# Patient Record
Sex: Female | Born: 1995 | Race: Black or African American | Hispanic: No | Marital: Single | Smoking: Current every day smoker
Health system: Southern US, Community
[De-identification: ages and names within clinical notes are randomized; demographics above are authoritative.]

## PROBLEM LIST (undated history)

## (undated) DIAGNOSIS — K5900 Constipation, unspecified: Secondary | ICD-10-CM

## (undated) DIAGNOSIS — A599 Trichomoniasis, unspecified: Secondary | ICD-10-CM

## (undated) DIAGNOSIS — B019 Varicella without complication: Secondary | ICD-10-CM

## (undated) HISTORY — PX: HERNIA REPAIR: SHX51

---
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
## 2017-04-07 ENCOUNTER — Other Ambulatory Visit (HOSPITAL_COMMUNITY)
Admission: RE | Admit: 2017-04-07 | Discharge: 2017-04-07 | Disposition: A | Discharge status: Home / Self Care | Payer: Medicaid Other | Source: Ambulatory Visit | Attending: Family Medicine | Admitting: Family Medicine

## 2017-04-07 ENCOUNTER — Ambulatory Visit (INDEPENDENT_AMBULATORY_CARE_PROVIDER_SITE_OTHER): Payer: Medicaid Other | Admitting: Family Medicine

## 2017-04-07 ENCOUNTER — Encounter: Payer: Self-pay | Admitting: Family Medicine

## 2017-04-07 VITALS — BP 127/80 | HR 64 | Ht 63.5 in | Wt 130.0 lb

## 2017-04-07 DIAGNOSIS — Z113 Encounter for screening for infections with a predominantly sexual mode of transmission: Secondary | ICD-10-CM | POA: Insufficient documentation

## 2017-04-07 DIAGNOSIS — Z01419 Encounter for gynecological examination (general) (routine) without abnormal findings: Secondary | ICD-10-CM | POA: Diagnosis not present

## 2017-04-07 DIAGNOSIS — N839 Noninflammatory disorder of ovary, fallopian tube and broad ligament, unspecified: Secondary | ICD-10-CM

## 2017-04-07 DIAGNOSIS — Z0000 Encounter for general adult medical examination without abnormal findings: Secondary | ICD-10-CM

## 2017-04-07 NOTE — Progress Notes (Signed)
GYNECOLOGY ANNUAL PREVENTATIVE CARE ENCOUNTER NOTE  Subjective:   Diana Ramirez is a 21 y.o. G0P0 female here for a routine annual gynecologic exam.  Current complaints: none.   Denies abnormal vaginal bleeding, discharge, pelvic pain, or other gynecologic concerns.   Has been trying to get pregnant over the past 2 years, but has not been able to. Has irregular menses.   Gynecologic History Patient's last menstrual period was 04/02/2017 (exact date). Patient is sexually active  Contraception: none Last Pap: n/a.  Last mammogram: n/a.   Obstetric History OB History  Gravida Para Term Preterm AB Living  0            SAB TAB Ectopic Multiple Live Births                   Past Medical History:  Diagnosis Date  . Constipation   . Trichomonas   . Varicella     Past Surgical History:  Procedure Laterality Date  . HERNIA REPAIR      Current Outpatient Prescriptions on File Prior to Visit  Medication Sig Dispense Refill  . cetirizine (ZYRTEC) 10 MG tablet Take 10 mg by mouth at bedtime as needed for allergies.    . Pseudoephedrine-APAP-DM (DAYQUIL PO) Take 30 mLs by mouth daily as needed (For cold symptoms.).     No current facility-administered medications on file prior to visit.     No Known Allergies  Social History   Social History  . Marital status: Single    Spouse name: N/A  . Number of children: N/A  . Years of education: N/A   Occupational History  . Not on file.   Social History Main Topics  . Smoking status: Current Every Day Smoker    Packs/day: 0.25    Years: 4.00    Types: Cigarettes  . Smokeless tobacco: Never Used  . Alcohol use Yes     Comment: occas.  . Drug use: Yes    Types: Marijuana     Comment: occas.  . Sexual activity: Yes    Birth control/ protection: Condom   Other Topics Concern  . Not on file   Social History Narrative  . No narrative on file    Family History  Problem Relation Age of Onset  . Kidney disease  Mother   . Hypertension Mother   . HIV Mother   . Diabetes Maternal Grandmother   . Hypertension Maternal Grandmother     The following portions of the patient's history were reviewed and updated as appropriate: allergies, current medications, past family history, past medical history, past social history, past surgical history and problem list.  Review of Systems Pertinent items are noted in HPI.   Objective:  BP 127/80 (BP Location: Left Arm)   Pulse 64   Ht 5' 3.5" (1.613 m)   Wt 130 lb (59 kg)   LMP 04/02/2017 (Exact Date)   BMI 22.67 kg/m  CONSTITUTIONAL: Well-developed, well-nourished female in no acute distress.  HENT:  Normocephalic, atraumatic, External right and left ear normal. Oropharynx is clear and moist EYES: Conjunctivae and EOM are normal. Pupils are equal, round, and reactive to light. No scleral icterus.  NECK: Normal range of motion, supple, no masses.  Normal thyroid.   CARDIOVASCULAR: Normal heart rate noted, regular rhythm RESPIRATORY: Clear to auscultation bilaterally. Effort and breath sounds normal, no problems with respiration noted. BREASTS: Symmetric in size. No masses, skin changes, nipple drainage, or lymphadenopathy. ABDOMEN: Soft, normal bowel  sounds, no distention noted.  No tenderness, rebound or guarding.  PELVIC: Normal appearing external genitalia; normal appearing vaginal mucosa and cervix.  No abnormal discharge noted.  Pap smear obtained.  Normal uterine size, no other palpable masses, no uterine or adnexal tenderness. MUSCULOSKELETAL: Normal range of motion. No tenderness.  No cyanosis, clubbing, or edema.  2+ distal pulses. SKIN: Skin is warm and dry. No rash noted. Not diaphoretic. No erythema. No pallor. NEUROLOGIC: Alert and oriented to person, place, and time. Normal reflexes, muscle tone coordination. No cranial nerve deficit noted. PSYCHIATRIC: Normal mood and affect. Normal behavior. Normal judgment and thought content.  Assessment:   Annual gynecologic examination with pap smear   Plan:  1. Annual exam Discussed diet and exercise.  2. STD screening STD testing discussed. Patient requested testing  3. Problems with ovulation Will get Day 21 progesterone level.  If normal - would recommend OTC ovulation kit.  Routine preventative health maintenance measures emphasized. Please refer to After Visit Summary for other counseling recommendations.    Loma Boston, [place] for [employer]

## 2017-04-08 LAB — HEPATITIS C ANTIBODY

## 2017-04-08 LAB — HEPATITIS B SURFACE ANTIGEN: HEP B S AG: NEGATIVE

## 2017-04-08 LAB — RPR: RPR Ser Ql: NONREACTIVE

## 2017-04-08 LAB — HIV ANTIBODY (ROUTINE TESTING W REFLEX): HIV SCREEN 4TH GENERATION: NONREACTIVE

## 2017-04-11 LAB — GC/CHLAMYDIA PROBE AMP (~~LOC~~) NOT AT ARMC
Chlamydia: NEGATIVE
NEISSERIA GONORRHEA: NEGATIVE

## 2017-04-25 ENCOUNTER — Other Ambulatory Visit: Payer: Medicaid Other

## 2017-04-26 ENCOUNTER — Other Ambulatory Visit: Payer: Medicaid Other

## 2017-04-26 DIAGNOSIS — N839 Noninflammatory disorder of ovary, fallopian tube and broad ligament, unspecified: Secondary | ICD-10-CM

## 2017-04-26 NOTE — Progress Notes (Signed)
Patient present for day 21 progestrone level per Dr. Adrian Blackwater order at last visit. Armandina Stammer RNBSN

## 2017-04-27 LAB — PROGESTERONE: Progesterone: 10.1 ng/mL

## 2017-05-12 ENCOUNTER — Telehealth: Payer: Self-pay

## 2017-05-12 NOTE — Telephone Encounter (Signed)
Attempted to reach patient. Patient voicemailbox not set up yet. Armandina Stammer RNBSN

## 2017-05-12 NOTE — Telephone Encounter (Signed)
-----  Message from Jacob J Stinson, DO sent at 05/10/2017 11:50 AM EDT ----- Progesterone level ok. No problems with ovulation. She may want to get an OTC ovulation kit that may help her time her ovulation if she is looking at getting pregnant still. 

## 2019-11-28 ENCOUNTER — Ambulatory Visit
Admission: EM | Admit: 2019-11-28 | Discharge: 2019-11-28 | Disposition: A | Discharge status: Home / Self Care | Payer: Medicaid Other | Attending: Family Medicine | Admitting: Family Medicine

## 2019-11-28 ENCOUNTER — Other Ambulatory Visit: Payer: Self-pay

## 2019-11-28 DIAGNOSIS — R6883 Chills (without fever): Secondary | ICD-10-CM

## 2019-11-28 DIAGNOSIS — R05 Cough: Secondary | ICD-10-CM

## 2019-11-28 DIAGNOSIS — J3489 Other specified disorders of nose and nasal sinuses: Secondary | ICD-10-CM

## 2019-11-28 DIAGNOSIS — R112 Nausea with vomiting, unspecified: Secondary | ICD-10-CM

## 2019-11-28 DIAGNOSIS — R059 Cough, unspecified: Secondary | ICD-10-CM

## 2019-11-28 DIAGNOSIS — R0981 Nasal congestion: Secondary | ICD-10-CM

## 2019-11-28 DIAGNOSIS — R52 Pain, unspecified: Secondary | ICD-10-CM

## 2019-11-28 DIAGNOSIS — M79602 Pain in left arm: Secondary | ICD-10-CM

## 2019-11-28 MED ORDER — NAPROXEN 500 MG PO TABS
500.0000 mg | ORAL_TABLET | Freq: Two times a day (BID) | ORAL | 0 refills | Status: DC
Start: 2019-11-28 — End: 2022-03-25

## 2019-11-28 MED ORDER — BENZONATATE 100 MG PO CAPS
100.0000 mg | ORAL_CAPSULE | Freq: Three times a day (TID) | ORAL | 0 refills | Status: DC
Start: 2019-11-28 — End: 2022-03-25

## 2019-11-28 MED ORDER — CYCLOBENZAPRINE HCL 10 MG PO TABS
10.0000 mg | ORAL_TABLET | Freq: Two times a day (BID) | ORAL | 0 refills | Status: DC | PRN
Start: 2019-11-28 — End: 2022-03-25

## 2019-11-28 MED ORDER — CETIRIZINE HCL 10 MG PO TABS: 10.0000 mg | ORAL_TABLET | Freq: Every day | ORAL | 0 refills | Status: DC
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
## 2019-11-28 NOTE — Discharge Instructions (Addendum)
Your COVID test is pending.  You should self quarantine until the test result is back.    Take Tylenol as needed for fever or discomfort.  Rest and keep yourself hydrated.    Go to the emergency department if you develop shortness of breath, severe diarrhea, high fever not relieved by Tylenol or ibuprofen, or other concerning symptoms.    I have sent in tessalon perles for cough, and zyrtec for congestion and runny nose.  I have also sent in Naproxen for pain and cyclobenzaprine for muscle spasms
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
## 2019-11-29 LAB — SARS-COV-2, NAA 2 DAY TAT

## 2019-11-29 LAB — NOVEL CORONAVIRUS, NAA: SARS-CoV-2, NAA: NOT DETECTED

## 2020-06-17 ENCOUNTER — Emergency Department (HOSPITAL_COMMUNITY): Payer: Self-pay

## 2020-06-17 ENCOUNTER — Other Ambulatory Visit: Payer: Self-pay

## 2020-06-17 ENCOUNTER — Encounter (HOSPITAL_COMMUNITY): Payer: Self-pay | Admitting: Emergency Medicine

## 2020-06-17 ENCOUNTER — Emergency Department (HOSPITAL_COMMUNITY)
Admission: EM | Admit: 2020-06-17 | Discharge: 2020-06-17 | Disposition: A | Discharge status: Home / Self Care | Payer: Self-pay | Attending: Emergency Medicine | Admitting: Emergency Medicine

## 2020-06-17 DIAGNOSIS — Z23 Encounter for immunization: Secondary | ICD-10-CM | POA: Insufficient documentation

## 2020-06-17 DIAGNOSIS — S61212A Laceration without foreign body of right middle finger without damage to nail, initial encounter: Secondary | ICD-10-CM | POA: Insufficient documentation

## 2020-06-17 DIAGNOSIS — S61210A Laceration without foreign body of right index finger without damage to nail, initial encounter: Secondary | ICD-10-CM | POA: Insufficient documentation

## 2020-06-17 DIAGNOSIS — S61219A Laceration without foreign body of unspecified finger without damage to nail, initial encounter: Secondary | ICD-10-CM

## 2020-06-17 DIAGNOSIS — Z91410 Personal history of adult physical and sexual abuse: Secondary | ICD-10-CM | POA: Insufficient documentation

## 2020-06-17 DIAGNOSIS — F17210 Nicotine dependence, cigarettes, uncomplicated: Secondary | ICD-10-CM | POA: Insufficient documentation

## 2020-06-17 DIAGNOSIS — S31821A Laceration without foreign body of left buttock, initial encounter: Secondary | ICD-10-CM | POA: Insufficient documentation

## 2020-06-17 MED ORDER — CEPHALEXIN 500 MG PO CAPS
500.0000 mg | ORAL_CAPSULE | Freq: Four times a day (QID) | ORAL | 0 refills | Status: DC
Start: 2020-06-17 — End: 2022-03-25

## 2020-06-17 MED ORDER — TETANUS-DIPHTH-ACELL PERTUSSIS 5-2.5-18.5 LF-MCG/0.5 IM SUSY
0.5000 mL | PREFILLED_SYRINGE | Freq: Once | INTRAMUSCULAR | Status: AC
  Administered 2020-06-17: 0.5 mL via INTRAMUSCULAR
  Filled 2020-06-17: qty 0.5
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
## 2020-12-01 IMAGING — DX DG HAND COMPLETE 3+V*R*
3 series · 3 of 3 positions shown · non-contrast
Comparison: None.

CLINICAL DATA: Lacerations

EXAM:
RIGHT HAND - COMPLETE 3+ VIEW

[hand pa]
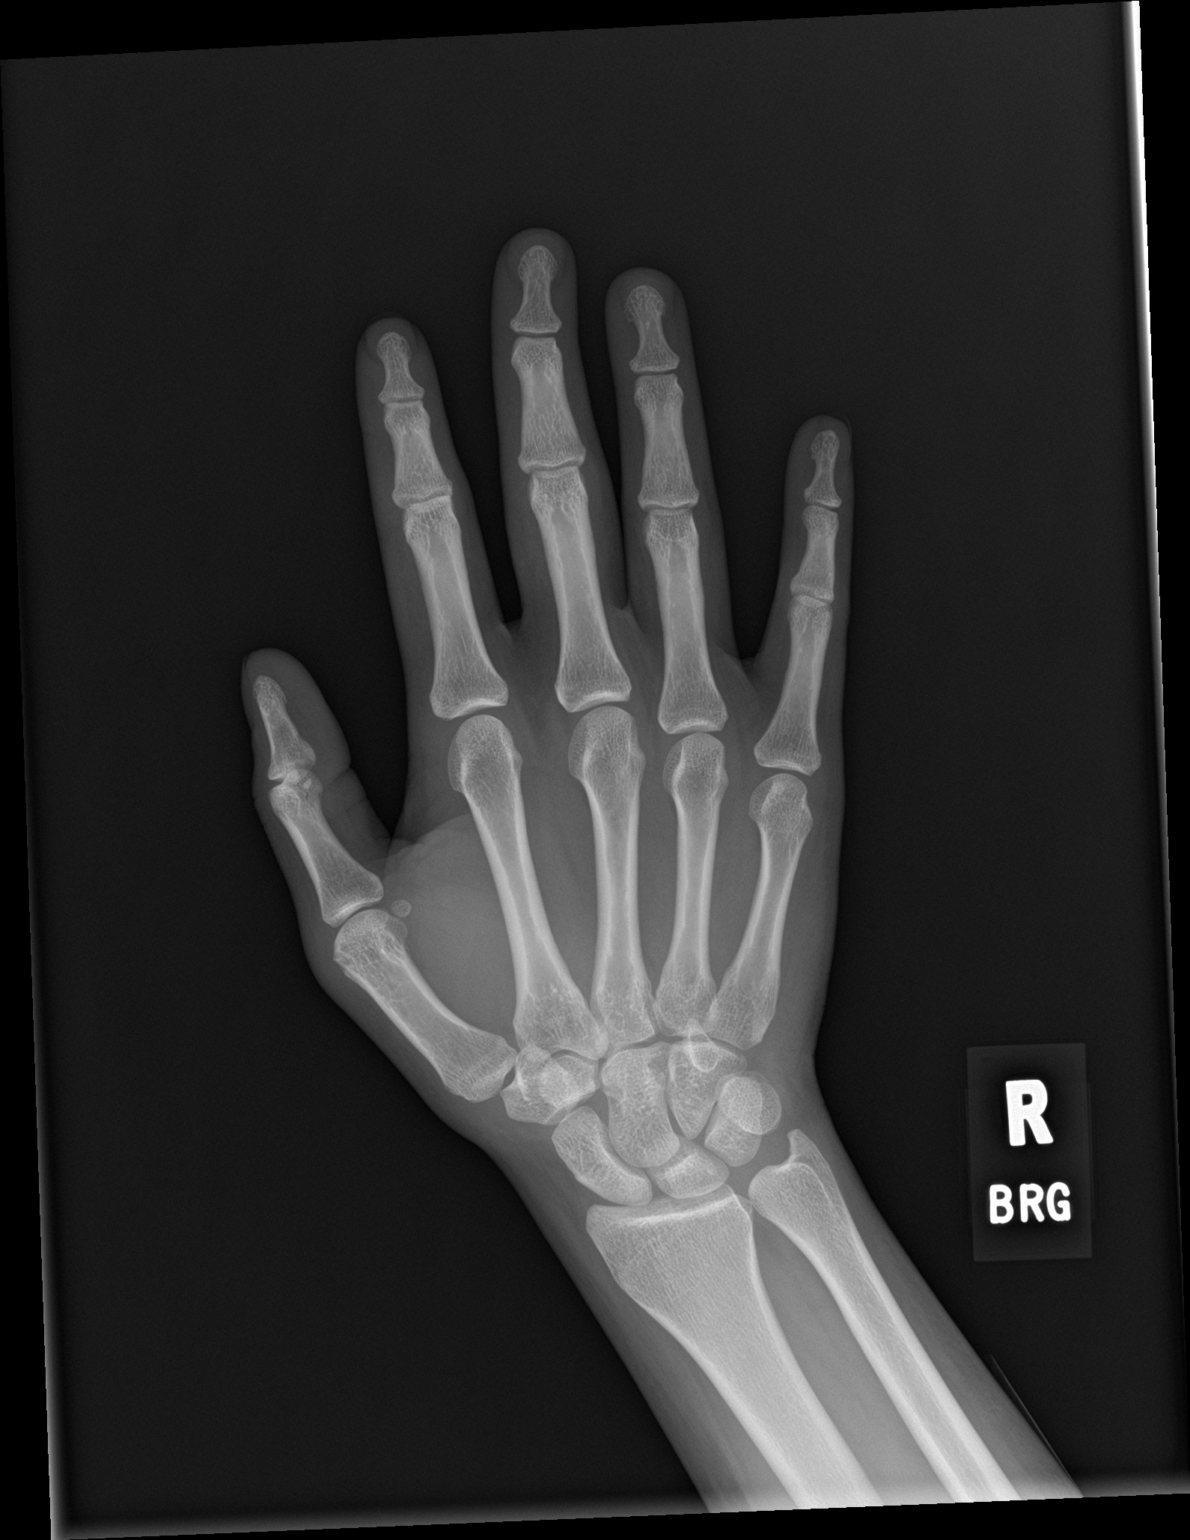

[hand obl]
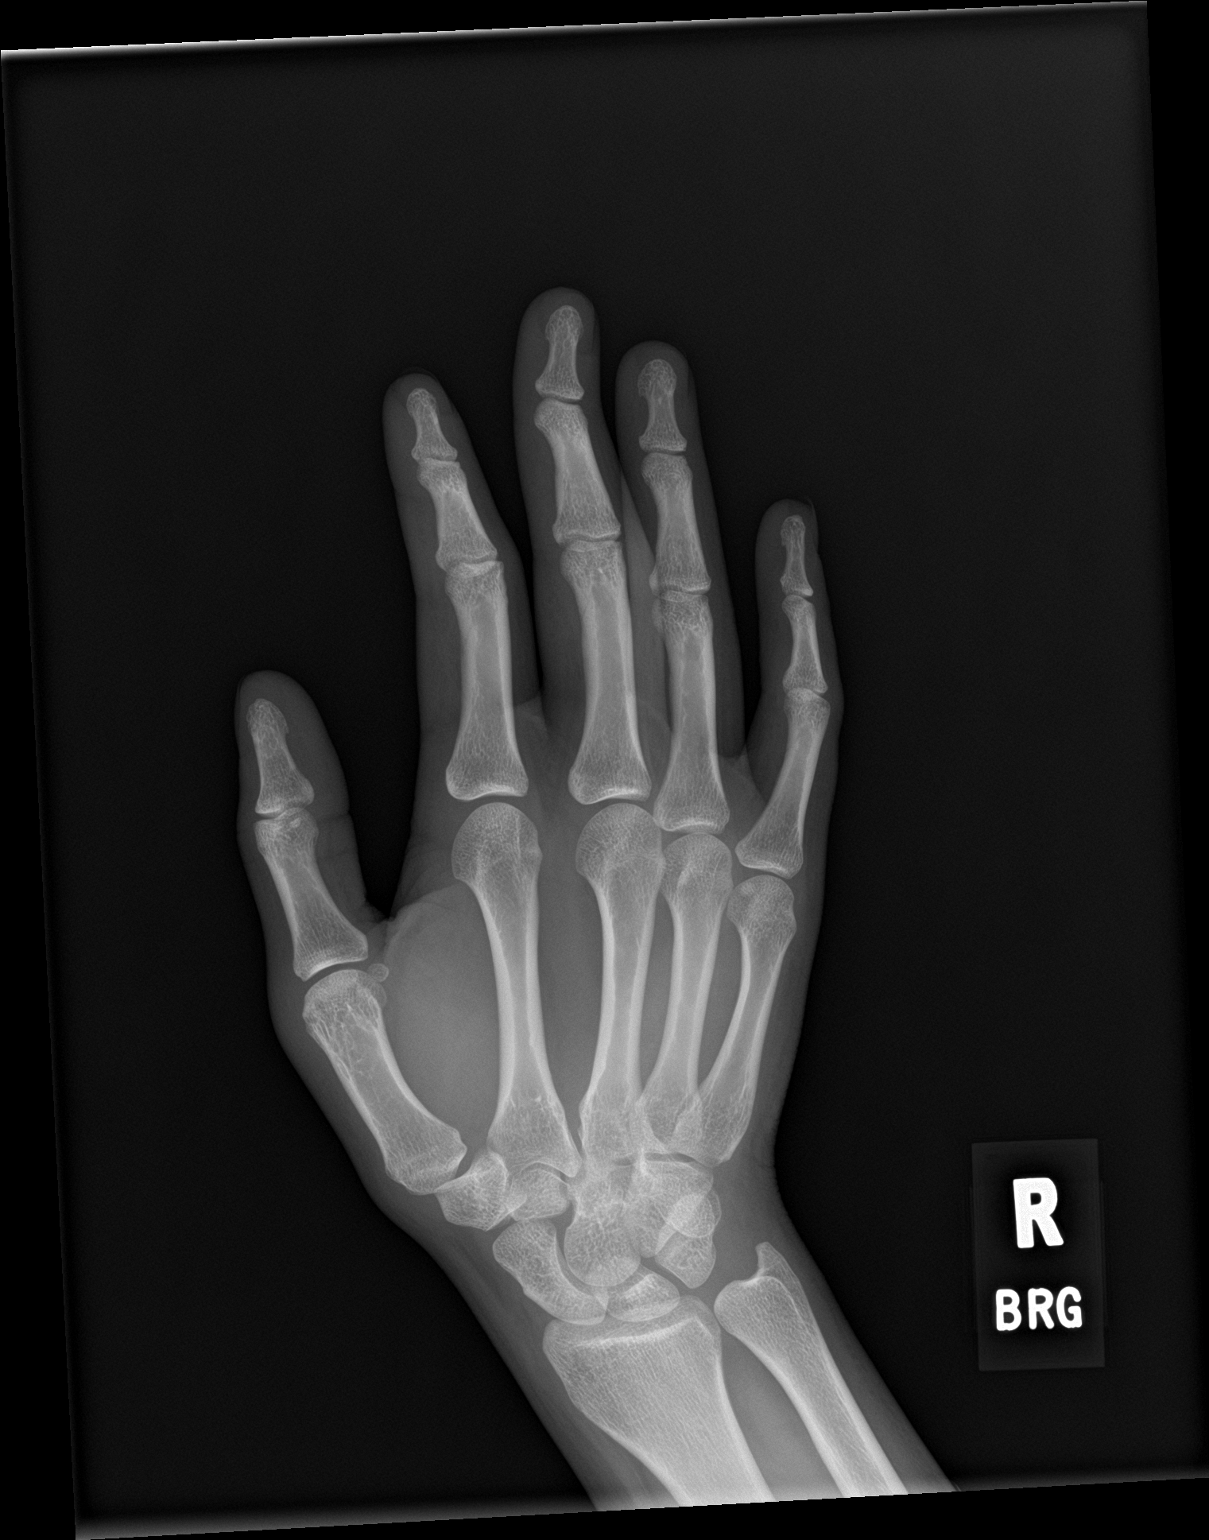

[hand lat]
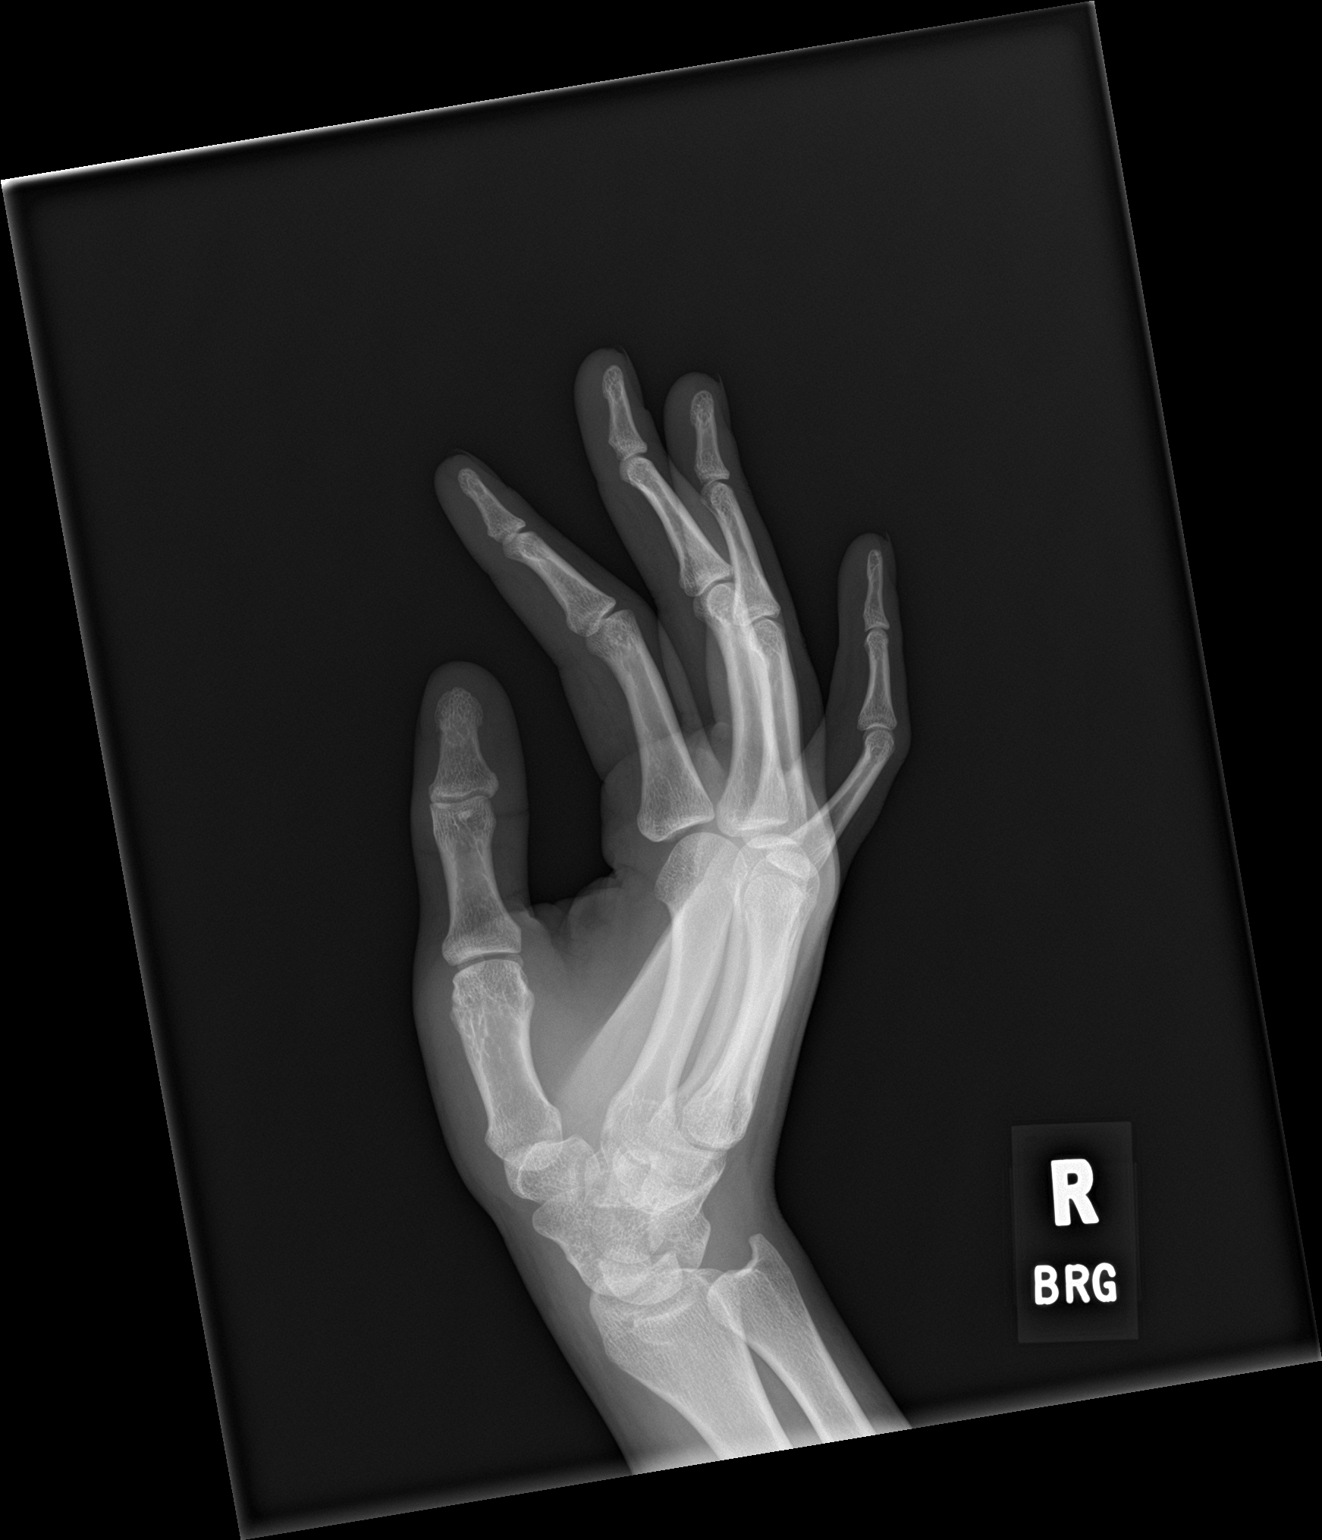

[3 of 3 positions shown; findings below may reference images not displayed]

FINDINGS: There is no evidence of fracture or dislocation. Joint spaces are
preserved. There is no radiopaque foreign body.
IMPRESSION: No acute fracture.  No radiopaque foreign body.

## 2021-04-10 ENCOUNTER — Encounter: Payer: Self-pay | Admitting: Emergency Medicine

## 2021-04-10 ENCOUNTER — Ambulatory Visit
Admission: EM | Admit: 2021-04-10 | Discharge: 2021-04-10 | Disposition: A | Discharge status: Home / Self Care | Payer: Medicaid Other | Attending: Emergency Medicine | Admitting: Emergency Medicine

## 2021-04-10 ENCOUNTER — Other Ambulatory Visit: Payer: Self-pay

## 2021-04-10 DIAGNOSIS — Z20822 Contact with and (suspected) exposure to covid-19: Secondary | ICD-10-CM

## 2021-04-10 NOTE — ED Triage Notes (Signed)
Test for COVID exposure.

## 2021-04-11 LAB — COVID-19, FLU A+B NAA
Influenza A, NAA: NOT DETECTED
Influenza B, NAA: NOT DETECTED
SARS-CoV-2, NAA: NOT DETECTED

## 2022-03-25 ENCOUNTER — Encounter: Payer: Self-pay | Admitting: Obstetrics & Gynecology

## 2022-03-25 ENCOUNTER — Ambulatory Visit (INDEPENDENT_AMBULATORY_CARE_PROVIDER_SITE_OTHER): Payer: Self-pay | Admitting: Obstetrics & Gynecology

## 2022-03-25 ENCOUNTER — Other Ambulatory Visit (HOSPITAL_COMMUNITY)
Admission: RE | Admit: 2022-03-25 | Discharge: 2022-03-25 | Disposition: A | Discharge status: Home / Self Care | Payer: Medicaid Other | Source: Ambulatory Visit | Attending: Obstetrics & Gynecology | Admitting: Obstetrics & Gynecology

## 2022-03-25 VITALS — BP 100/64 | HR 80 | Ht 62.0 in

## 2022-03-25 DIAGNOSIS — Z124 Encounter for screening for malignant neoplasm of cervix: Secondary | ICD-10-CM | POA: Insufficient documentation

## 2022-03-25 DIAGNOSIS — Z113 Encounter for screening for infections with a predominantly sexual mode of transmission: Secondary | ICD-10-CM

## 2022-03-25 DIAGNOSIS — N979 Female infertility, unspecified: Secondary | ICD-10-CM

## 2022-03-25 NOTE — Progress Notes (Signed)
   GYN VISIT Patient name: Diana Ramirez MRN [id number]  Date of birth: [birth year] Chief Complaint:   Family planning  History of Present Illness:   Diana Ramirez is a 26 y.o. G0P0 female being seen today for the following concerns:  -Infertility: Pt wants to get "checked out" because she has not been able to get pregnant. Sexually active x 22yrs with same partner.  Denies prior pregnancy. @ 16yo- h/o gonorrhea and syphilis Denies prior abdominal surgery  She is also requesting STI screening  Partner is 35yo and has fathered a child- currently 8yo  Menses regular each month- denies HMB or dysmenorrhea.  Denies irregular discharge, itching or irritation.  No other acute gyn concerns.   Patient's last menstrual period was 03/24/2022.     03/25/2022    2:27 PM  Depression screen PHQ 2/9  Decreased Interest 0  Down, Depressed, Hopeless 3  PHQ - 2 Score 3  Altered sleeping 3  Tired, decreased energy 1  Change in appetite 2  Feeling bad or failure about yourself  2  Trouble concentrating 0  Moving slowly or fidgety/restless 0  Suicidal thoughts 0  PHQ-9 Score 11     Review of Systems:   Pertinent items are noted in HPI Denies fever/chills, dizziness, headaches, visual disturbances, fatigue, shortness of breath, chest pain, abdominal pain, vomiting, no problems with periods, bowel movements, urination, or intercourse unless otherwise stated above.  Pertinent History Reviewed:  Reviewed past medical,surgical, social, obstetrical and family history.  Reviewed problem list, medications and allergies. Physical Assessment:   Vitals:   03/25/22 1428  BP: 100/64  Pulse: 80  Height: 5\' 2"  (1.575 m)  Body mass index is 26.89 kg/m.       Physical Examination:   General appearance: alert, well appearing, and in no distress  Psych: mood appropriate, normal affect  Skin: warm & dry   Cardiovascular: normal heart rate noted, RRR  Respiratory: normal respiratory effort, no  distress, CTAB  Abdomen: soft, non-tender, no rebound, no guarding  Pelvic: normal external genitalia, vulva, vagina, cervix, uterus and adnexa  Extremities: no edema, no calf tenderness bilaterally  Chaperone:  pt declined     Assessment & Plan:  1) STI screening -plan for GC/C and blood work  2) Family planning, Infertility counseling -based on history 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   03/28/2023, DO Attending Obstetrician & Gynecologist, [facility] for [facility]

## 2022-03-26 ENCOUNTER — Other Ambulatory Visit: Payer: Medicaid Other

## 2022-03-26 DIAGNOSIS — Z113 Encounter for screening for infections with a predominantly sexual mode of transmission: Secondary | ICD-10-CM

## 2022-03-26 DIAGNOSIS — N979 Female infertility, unspecified: Secondary | ICD-10-CM

## 2022-03-27 ENCOUNTER — Encounter: Payer: Self-pay | Admitting: Obstetrics & Gynecology

## 2022-03-31 ENCOUNTER — Encounter (HOSPITAL_COMMUNITY): Payer: Self-pay

## 2022-03-31 ENCOUNTER — Inpatient Hospital Stay (HOSPITAL_COMMUNITY): Admission: RE | Admit: 2022-03-31 | Payer: Medicaid Other | Source: Ambulatory Visit

## 2022-04-01 LAB — CYTOLOGY - PAP
Chlamydia: NEGATIVE
Comment: NEGATIVE
Comment: NEGATIVE
Comment: NORMAL
Diagnosis: NEGATIVE
Neisseria Gonorrhea: NEGATIVE
Trichomonas: NEGATIVE

## 2022-04-01 LAB — TSH: TSH: 0.784 u[IU]/mL (ref 0.450–4.500)

## 2022-04-01 LAB — RPR: RPR Ser Ql: NONREACTIVE

## 2022-04-01 LAB — ANTI MULLERIAN HORMONE: ANTI-MULLERIAN HORMONE (AMH): 4.76 ng/mL

## 2022-04-01 LAB — PROLACTIN: Prolactin: 23.3 ng/mL (ref 4.8–23.3)

## 2022-04-01 LAB — HIV ANTIBODY (ROUTINE TESTING W REFLEX): HIV Screen 4th Generation wRfx: NONREACTIVE

## 2023-06-20 ENCOUNTER — Other Ambulatory Visit (INDEPENDENT_AMBULATORY_CARE_PROVIDER_SITE_OTHER): Payer: Managed Care, Other (non HMO)

## 2023-06-20 ENCOUNTER — Other Ambulatory Visit (HOSPITAL_COMMUNITY)
Admission: RE | Admit: 2023-06-20 | Discharge: 2023-06-20 | Disposition: A | Discharge status: Home / Self Care | Payer: Managed Care, Other (non HMO) | Source: Ambulatory Visit | Attending: Obstetrics & Gynecology | Admitting: Obstetrics & Gynecology

## 2023-06-20 DIAGNOSIS — B3731 Acute candidiasis of vulva and vagina: Secondary | ICD-10-CM | POA: Insufficient documentation

## 2023-06-20 DIAGNOSIS — B9689 Other specified bacterial agents as the cause of diseases classified elsewhere: Secondary | ICD-10-CM | POA: Insufficient documentation

## 2023-06-20 DIAGNOSIS — N760 Acute vaginitis: Secondary | ICD-10-CM | POA: Diagnosis not present

## 2023-06-20 DIAGNOSIS — Z113 Encounter for screening for infections with a predominantly sexual mode of transmission: Secondary | ICD-10-CM | POA: Diagnosis present

## 2023-06-20 DIAGNOSIS — N898 Other specified noninflammatory disorders of vagina: Secondary | ICD-10-CM
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
## 2023-06-21 ENCOUNTER — Other Ambulatory Visit: Payer: Self-pay | Admitting: Adult Health

## 2023-06-21 LAB — CERVICOVAGINAL ANCILLARY ONLY
Bacterial Vaginitis (gardnerella): POSITIVE — AB
Candida Glabrata: NEGATIVE
Candida Vaginitis: POSITIVE — AB
Chlamydia: NEGATIVE
Comment: NEGATIVE
Comment: NEGATIVE
Comment: NEGATIVE
Comment: NEGATIVE
Comment: NEGATIVE
Comment: NORMAL
Neisseria Gonorrhea: NEGATIVE
Trichomonas: NEGATIVE

## 2023-06-21 MED ORDER — FLUCONAZOLE 150 MG PO TABS: ORAL_TABLET | ORAL | 1 refills | Status: DC

## 2023-06-21 MED ORDER — METRONIDAZOLE 500 MG PO TABS: 500.0000 mg | ORAL_TABLET | Freq: Two times a day (BID) | ORAL | 0 refills | Status: DC

## 2023-06-21 NOTE — Progress Notes (Signed)
+  BV and yeast on vaginal swab, will rx flagyl, and diflucan, no sex or alcohol while taking meds

## 2023-09-19 ENCOUNTER — Other Ambulatory Visit (HOSPITAL_COMMUNITY)
Admission: RE | Admit: 2023-09-19 | Discharge: 2023-09-19 | Disposition: A | Discharge status: Home / Self Care | Payer: Medicaid Other | Source: Ambulatory Visit | Attending: Adult Health | Admitting: Adult Health

## 2023-09-19 ENCOUNTER — Encounter: Payer: Self-pay | Admitting: Adult Health

## 2023-09-19 ENCOUNTER — Ambulatory Visit (INDEPENDENT_AMBULATORY_CARE_PROVIDER_SITE_OTHER): Payer: Medicaid Other | Admitting: Adult Health

## 2023-09-19 VITALS — BP 128/75 | HR 84 | Ht 62.0 in | Wt 184.0 lb

## 2023-09-19 DIAGNOSIS — Z113 Encounter for screening for infections with a predominantly sexual mode of transmission: Secondary | ICD-10-CM | POA: Insufficient documentation

## 2023-09-19 DIAGNOSIS — Z202 Contact with and (suspected) exposure to infections with a predominantly sexual mode of transmission: Secondary | ICD-10-CM

## 2023-09-19 NOTE — Progress Notes (Signed)
  Subjective:     Patient ID: Diana Ramirez, female   DOB: Jun 22, 1996, 28 y.o.   MRN: [id number]  HPI [name] is a 28 year old black female,single, G0P0, in requesting STD testing. New partner says his ex had herpes.      Component Value Date/Time   DIAGPAP  03/25/2022 1434    - Negative for intraepithelial lesion or malignancy (NILM)   ADEQPAP  03/25/2022 1434    Satisfactory for evaluation; transformation zone component PRESENT.     Review of Systems Denies any discharge Has new partner Reviewed past medical,surgical, social and family history. Reviewed medications and allergies.     Objective:   Physical Exam BP 128/75 (BP Location: Right Arm, Patient Position: Sitting, Cuff Size: Normal)   Pulse 84   Ht 5\' 2"  (1.575 m)   Wt 184 lb (83.5 kg)   LMP 08/22/2023   BMI 33.65 kg/m     Skin warm and dry.Pelvic: external genitalia is normal in appearance, has comedone on right buttock vagina: pink,urethra has no lesions or masses noted, cervix:smooth and bulbous, uterus: normal size, shape and contour, non tender, no masses felt, adnexa: no masses or tenderness noted. Bladder is non tender and no masses felt.   Upstream - 09/19/23 1601       Pregnancy Intention Screening   Does the patient want to become pregnant in the next year? No    Does the patient's partner want to become pregnant in the next year? No    Would the patient like to discuss contraceptive options today? Yes      Contraception Wrap Up   Current Method Female Condom    End Method Female Condom    Contraception Counseling Provided No             Assessment:     1. Screen for STD (sexually transmitted disease) (Primary) CV swab sent for GC/CHL,trich.BV and yeast  Check HIV and RPR - Cervicovaginal ancillary only( [place]) - HIV Antibody (routine testing w rflx) - RPR    Use condoms Plan:     Follow up prn

## 2023-09-20 LAB — HIV ANTIBODY (ROUTINE TESTING W REFLEX): HIV Screen 4th Generation wRfx: NONREACTIVE

## 2023-09-20 LAB — CERVICOVAGINAL ANCILLARY ONLY
Bacterial Vaginitis (gardnerella): POSITIVE — AB
Candida Glabrata: NEGATIVE
Candida Vaginitis: POSITIVE — AB
Chlamydia: NEGATIVE
Comment: NEGATIVE
Comment: NEGATIVE
Comment: NEGATIVE
Comment: NEGATIVE
Comment: NEGATIVE
Comment: NORMAL
Neisseria Gonorrhea: NEGATIVE
Trichomonas: POSITIVE — AB

## 2023-09-20 LAB — RPR: RPR Ser Ql: NONREACTIVE

## 2023-09-21 ENCOUNTER — Encounter: Payer: Self-pay | Admitting: Adult Health

## 2023-09-21 ENCOUNTER — Other Ambulatory Visit: Payer: Self-pay | Admitting: Adult Health

## 2023-09-21 DIAGNOSIS — A599 Trichomoniasis, unspecified: Secondary | ICD-10-CM | POA: Insufficient documentation

## 2023-09-21 MED ORDER — FLUCONAZOLE 150 MG PO TABS: ORAL_TABLET | ORAL | 1 refills | Status: DC

## 2023-09-21 MED ORDER — METRONIDAZOLE 500 MG PO TABS: 500.0000 mg | ORAL_TABLET | Freq: Two times a day (BID) | ORAL | 0 refills | Status: DC

## 2023-09-21 NOTE — Progress Notes (Signed)
+  BV, yeast and trich on vaginal swab, sent rx in for diflucan and flagyl, no sex or alcohol while taking. Diana Ramirez is STD so partner needs to be treated too. He can see his MD or go to the health dept, or I can treat, but will need his name,dob,any allergies and drug store. You will need to get proof of cure in 2 weeks for the trich.

## 2023-10-12 ENCOUNTER — Other Ambulatory Visit (HOSPITAL_COMMUNITY)
Admission: RE | Admit: 2023-10-12 | Discharge: 2023-10-12 | Disposition: A | Discharge status: Home / Self Care | Source: Ambulatory Visit | Attending: Obstetrics & Gynecology | Admitting: Obstetrics & Gynecology

## 2023-10-12 ENCOUNTER — Ambulatory Visit (INDEPENDENT_AMBULATORY_CARE_PROVIDER_SITE_OTHER): Admitting: *Deleted

## 2023-10-12 DIAGNOSIS — Z8619 Personal history of other infectious and parasitic diseases: Secondary | ICD-10-CM | POA: Insufficient documentation

## 2023-10-12 DIAGNOSIS — Z09 Encounter for follow-up examination after completed treatment for conditions other than malignant neoplasm: Secondary | ICD-10-CM | POA: Diagnosis present

## 2023-10-12 DIAGNOSIS — Z3202 Encounter for pregnancy test, result negative: Secondary | ICD-10-CM

## 2023-10-12 DIAGNOSIS — Z3200 Encounter for pregnancy test, result unknown: Secondary | ICD-10-CM

## 2023-10-12 LAB — POCT URINE PREGNANCY: Preg Test, Ur: NEGATIVE

## 2023-10-12 NOTE — Progress Notes (Signed)
   NURSE VISIT- VAGINITIS/STD/POC  SUBJECTIVE:  Diana Ramirez is a 28 y.o. G0P0 GYN patientfemale here for a vaginal swab for proof of cure after treatment for Trichomonas.  She reports the following symptoms: none for 0 days. Denies abnormal vaginal bleeding, significant pelvic pain, fever, or UTI symptoms.  OBJECTIVE:  LMP 08/22/2023   Appears well, in no apparent distress  ASSESSMENT: Vaginal swab for proof of cure after treatment for trich  PLAN: Self-collected vaginal probe for Gonorrhea, Chlamydia, Trichomonas, Bacterial Vaginosis, Yeast sent to lab Treatment: to be determined once results are received Follow-up as needed if symptoms persist/worsen, or new symptoms develop  Pt also requested upt. Results are negative.   Annamarie Dawley  10/12/2023 4:13 PM

## 2023-10-14 ENCOUNTER — Other Ambulatory Visit: Payer: Self-pay | Admitting: Adult Health

## 2023-10-14 LAB — CERVICOVAGINAL ANCILLARY ONLY
Bacterial Vaginitis (gardnerella): POSITIVE — AB
Candida Glabrata: NEGATIVE
Candida Vaginitis: NEGATIVE
Chlamydia: NEGATIVE
Comment: NEGATIVE
Comment: NEGATIVE
Comment: NEGATIVE
Comment: NEGATIVE
Comment: NEGATIVE
Comment: NORMAL
Neisseria Gonorrhea: NEGATIVE
Trichomonas: NEGATIVE

## 2023-10-14 MED ORDER — METRONIDAZOLE 500 MG PO TABS: 500.0000 mg | ORAL_TABLET | Freq: Two times a day (BID) | ORAL | 0 refills | Status: DC

## 2023-10-14 NOTE — Progress Notes (Signed)
+  BV on vaginal swab will rx flagyl,no sex or alcohol while taking  ?

## 2023-11-07 ENCOUNTER — Ambulatory Visit

## 2024-03-14 ENCOUNTER — Other Ambulatory Visit (HOSPITAL_COMMUNITY)
Admission: RE | Admit: 2024-03-14 | Discharge: 2024-03-14 | Disposition: A | Discharge status: Home / Self Care | Source: Ambulatory Visit | Attending: Obstetrics & Gynecology | Admitting: Obstetrics & Gynecology

## 2024-03-14 ENCOUNTER — Encounter: Payer: Self-pay | Admitting: Adult Health

## 2024-03-14 ENCOUNTER — Ambulatory Visit: Admitting: Adult Health

## 2024-03-14 VITALS — BP 124/81 | HR 77 | Ht 62.0 in | Wt 171.5 lb

## 2024-03-14 DIAGNOSIS — Z319 Encounter for procreative management, unspecified: Secondary | ICD-10-CM

## 2024-03-14 DIAGNOSIS — F17200 Nicotine dependence, unspecified, uncomplicated: Secondary | ICD-10-CM

## 2024-03-14 DIAGNOSIS — Z113 Encounter for screening for infections with a predominantly sexual mode of transmission: Secondary | ICD-10-CM | POA: Insufficient documentation

## 2024-03-14 DIAGNOSIS — N926 Irregular menstruation, unspecified: Secondary | ICD-10-CM | POA: Diagnosis not present

## 2024-03-14 DIAGNOSIS — Z01419 Encounter for gynecological examination (general) (routine) without abnormal findings: Secondary | ICD-10-CM | POA: Diagnosis not present

## 2024-03-14 DIAGNOSIS — Z3202 Encounter for pregnancy test, result negative: Secondary | ICD-10-CM | POA: Diagnosis not present

## 2024-03-14 DIAGNOSIS — Z1331 Encounter for screening for depression: Secondary | ICD-10-CM

## 2024-03-14 DIAGNOSIS — F1090 Alcohol use, unspecified, uncomplicated: Secondary | ICD-10-CM

## 2024-03-14 DIAGNOSIS — F32A Depression, unspecified: Secondary | ICD-10-CM | POA: Insufficient documentation

## 2024-03-14 DIAGNOSIS — F418 Other specified anxiety disorders: Secondary | ICD-10-CM

## 2024-03-14 DIAGNOSIS — F419 Anxiety disorder, unspecified: Secondary | ICD-10-CM | POA: Insufficient documentation

## 2024-03-14 LAB — POCT URINE PREGNANCY: Preg Test, Ur: NEGATIVE

## 2024-03-14 MED ORDER — ESCITALOPRAM OXALATE 10 MG PO TABS: 10.0000 mg | ORAL_TABLET | Freq: Every day | ORAL | 2 refills | Status: DC

## 2024-03-14 MED ORDER — PRENATAL PLUS 27-1 MG PO TABS: 1.0000 | ORAL_TABLET | Freq: Every day | ORAL | 12 refills | Status: AC

## 2024-03-14 NOTE — Progress Notes (Addendum)
 Patient ID: Diana Ramirez, female   DOB: October 27, 1995, 28 y.o.   MRN: [id number] History of Present Illness: Diana Ramirez is a 28 year old black female,with SO, G0P0, in for a well woman gyn exam. She has missed a period and wants UPT and requests STD testing. She would like to get pregnant.     Component Value Date/Time   DIAGPAP  03/25/2022 1434    - Negative for intraepithelial lesion or malignancy (NILM)   ADEQPAP  03/25/2022 1434    Satisfactory for evaluation; transformation zone component PRESENT.    High Risk HPV: Positive  Adequacy:  Satisfactory for evaluation, transformation zone component PRESENT  Diagnosis:  Atypical squamous cells of undetermined significance (ASC-US )    Current Medications, Allergies, Past Medical History, Past Surgical History, Family History and Social History were reviewed in [employer] record.     Review of Systems: Patient denies any headaches, hearing loss, fatigue, blurred vision, shortness of breath, chest pain, abdominal pain, problems with bowel movements, urination, or intercourse. No joint pain or mood swings.  See HPI for positives    Physical Exam:BP 124/81 (BP Location: Left Arm, Patient Position: Sitting, Cuff Size: Normal)   Pulse 77   Ht 5' 2 (1.575 m)   Wt 171 lb 8 oz (77.8 kg)   LMP 01/31/2024 (Approximate)   BMI 31.37 kg/m  UPT is negative  General:  Well developed, well nourished, no acute distress Skin:  Warm and dry Neck:  Midline trachea, normal thyroid, good ROM, no lymphadenopathy Lungs; Clear to auscultation bilaterally Breast:  No dominant palpable mass, retraction, or nipple discharge Cardiovascular: Regular rate and rhythm Abdomen:  Soft, non tender, no hepatosplenomegaly Pelvic:  External genitalia is normal in appearance, no lesions.  The vagina is normal in appearance. Urethra has no lesions or masses. The cervix is smooth, CV swab obtained.  Uterus is felt to be normal size, shape, and  contour.  No adnexal masses or tenderness noted.Bladder is non tender, no masses felt Extremities/musculoskeletal:  No swelling or varicosities noted, no clubbing or cyanosis Psych:  No mood changes, alert and cooperative,seems happy AA is 7 Fall risk is low    03/14/2024    2:38 PM 03/25/2022    2:27 PM  Depression screen PHQ 2/9  Decreased Interest 1 0  Down, Depressed, Hopeless 2 3  PHQ - 2 Score 3 3  Altered sleeping 2 3  Tired, decreased energy 2 1  Change in appetite 2 2  Feeling bad or failure about yourself  1 2  Trouble concentrating 1 0  Moving slowly or fidgety/restless 0 0  Suicidal thoughts 0 0  PHQ-9 Score 11 11       03/14/2024    2:38 PM 03/25/2022    2:27 PM  GAD 7 : Generalized Anxiety Score  Nervous, Anxious, on Edge 2 0  Control/stop worrying 2 0  Worry too much - different things 2 2  Trouble relaxing 2 1  Restless 1 0  Easily annoyed or irritable 3 3  Afraid - awful might happen 2 0  Total GAD 7 Score 14 6    Upstream - 03/14/24 1446       Pregnancy Intention Screening   Does the patient want to become pregnant in the next year? Yes    Does the patient's partner want to become pregnant in the next year? Yes    Would the patient like to discuss contraceptive options today? No  Contraception Wrap Up   Current Method Pregnant/Seeking Pregnancy    End Method Pregnant/Seeking Pregnancy    Contraception Counseling Provided No           Examination chaperoned by Clarita Salt LPN   Impression and plan: 1. Negative pregnancy test  - POCT urine pregnancy  2. Missed period Missed period, had negative UPT   3. Encounter for well woman exam with routine gynecological exam (Primary) Pap and physical in 1 year Get PCP at [facility] on her medicaid card   4. Screen for STD (sexually transmitted disease) CV swab sent for GC/CHL,trich,BV and yeast Check HIV and RPR  - HIV Antibody (routine testing w rflx) - RPR - Cervicovaginal ancillary  only( [place])  5. Patient desires pregnancy Had normal labs in 2023, did not get HSG, went to jail  Will rx PNV Call with next period or not, will check progesterone  level day 21, but if no period by October make appt  Her partner is 40, and has kids  Decrease THC use too  6. Smoker Try to start decreasing   7. Alcohol use Try to decrease and stop now  8. Anxiety and depression Will rx lexapro  10 mg 1 daily  Meds ordered this encounter  Medications   prenatal vitamin w/FE, FA (PRENATAL 1 + 1) 27-1 MG TABS tablet    Sig: Take 1 tablet by mouth daily at 12 noon.    Dispense:  30 tablet    Refill:  12    Supervising Provider:   JAYNE MINDER H [[id number]]   escitalopram  (LEXAPRO ) 10 MG tablet    Sig: Take 1 tablet (10 mg total) by mouth daily.    Dispense:  30 tablet    Refill:  2    Supervising Provider:   JAYNE MINDER H [[id number]]   Follow up in 3 months for ROS

## 2024-03-15 ENCOUNTER — Ambulatory Visit: Payer: Self-pay | Admitting: Adult Health

## 2024-03-15 LAB — HIV ANTIBODY (ROUTINE TESTING W REFLEX): HIV Screen 4th Generation wRfx: NONREACTIVE

## 2024-03-15 LAB — RPR: RPR Ser Ql: NONREACTIVE

## 2024-03-16 LAB — CERVICOVAGINAL ANCILLARY ONLY
Bacterial Vaginitis (gardnerella): POSITIVE — AB
Candida Glabrata: NEGATIVE
Candida Vaginitis: NEGATIVE
Chlamydia: NEGATIVE
Comment: NEGATIVE
Comment: NEGATIVE
Comment: NEGATIVE
Comment: NEGATIVE
Comment: NEGATIVE
Comment: NORMAL
Neisseria Gonorrhea: NEGATIVE
Trichomonas: NEGATIVE

## 2024-03-19 MED ORDER — METRONIDAZOLE 500 MG PO TABS: 500.0000 mg | ORAL_TABLET | Freq: Two times a day (BID) | ORAL | 0 refills | Status: DC

## 2024-03-21 ENCOUNTER — Other Ambulatory Visit: Payer: Self-pay | Admitting: Adult Health

## 2024-03-21 DIAGNOSIS — Z319 Encounter for procreative management, unspecified: Secondary | ICD-10-CM

## 2024-03-21 NOTE — Progress Notes (Signed)
 Ck progesterone  level 04/09/24

## 2024-04-27 ENCOUNTER — Ambulatory Visit: Payer: Self-pay | Admitting: Adult Health

## 2024-04-27 LAB — PROGESTERONE: Progesterone: 0.2 ng/mL

## 2024-05-10 ENCOUNTER — Ambulatory Visit: Payer: Self-pay | Admitting: Adult Health

## 2024-06-14 ENCOUNTER — Ambulatory Visit: Admitting: Adult Health

## 2024-07-24 ENCOUNTER — Other Ambulatory Visit (HOSPITAL_COMMUNITY)
Admission: RE | Admit: 2024-07-24 | Discharge: 2024-07-24 | Disposition: A | Discharge status: Home / Self Care | Source: Ambulatory Visit | Attending: Obstetrics & Gynecology | Admitting: Obstetrics & Gynecology

## 2024-07-24 ENCOUNTER — Ambulatory Visit (INDEPENDENT_AMBULATORY_CARE_PROVIDER_SITE_OTHER): Admitting: *Deleted

## 2024-07-24 DIAGNOSIS — Z113 Encounter for screening for infections with a predominantly sexual mode of transmission: Secondary | ICD-10-CM

## 2024-07-24 DIAGNOSIS — N898 Other specified noninflammatory disorders of vagina: Secondary | ICD-10-CM | POA: Insufficient documentation

## 2024-07-24 LAB — CERVICOVAGINAL ANCILLARY ONLY
Bacterial Vaginitis (gardnerella): POSITIVE — AB
Candida Glabrata: NEGATIVE
Candida Vaginitis: POSITIVE — AB
Chlamydia: NEGATIVE
Comment: NEGATIVE
Comment: NEGATIVE
Comment: NEGATIVE
Comment: NEGATIVE
Comment: NEGATIVE
Comment: NORMAL
Neisseria Gonorrhea: NEGATIVE
Trichomonas: NEGATIVE

## 2024-07-24 NOTE — Progress Notes (Signed)
" ° °  NURSE VISIT- VAGINITIS/STD  SUBJECTIVE:  Diana Ramirez is a 28 y.o. G0P0 GYN patientfemale here for a vaginal swab for vaginitis screening, STD screen.  She reports the following symptoms: vaginal itching for 1 week. Denies abnormal vaginal bleeding, significant pelvic pain, fever, or UTI symptoms.  OBJECTIVE:  There were no vitals taken for this visit.  Appears well, in no apparent distress  ASSESSMENT: Vaginal swab for vaginitis screening & STD screening  PLAN: Self-collected vaginal probe for Gonorrhea, Chlamydia, Trichomonas, Bacterial Vaginosis, Yeast sent to lab Treatment: to be determined once results are received Follow-up as needed if symptoms persist/worsen, or new symptoms develop  Clarita Salt  07/24/2024 11:52 AM  "

## 2024-07-30 ENCOUNTER — Ambulatory Visit: Payer: Self-pay | Admitting: Women's Health

## 2024-07-30 MED ORDER — METRONIDAZOLE 500 MG PO TABS: 500.0000 mg | ORAL_TABLET | Freq: Two times a day (BID) | ORAL | 0 refills | Status: DC

## 2024-07-30 MED ORDER — FLUCONAZOLE 150 MG PO TABS: 150.0000 mg | ORAL_TABLET | Freq: Once | ORAL | 0 refills | Status: AC

## 2024-09-05 ENCOUNTER — Ambulatory Visit: Admitting: Adult Health

## 2024-09-05 ENCOUNTER — Other Ambulatory Visit (HOSPITAL_COMMUNITY): Admission: RE | Admit: 2024-09-05 | Discharge: 2024-09-05 | Disposition: A | Source: Ambulatory Visit

## 2024-09-05 ENCOUNTER — Encounter: Payer: Self-pay | Admitting: Adult Health

## 2024-09-05 VITALS — BP 109/70 | HR 75 | Ht 62.0 in | Wt 156.5 lb

## 2024-09-05 DIAGNOSIS — L739 Follicular disorder, unspecified: Secondary | ICD-10-CM | POA: Insufficient documentation

## 2024-09-05 DIAGNOSIS — N898 Other specified noninflammatory disorders of vagina: Secondary | ICD-10-CM

## 2024-09-05 DIAGNOSIS — Z113 Encounter for screening for infections with a predominantly sexual mode of transmission: Secondary | ICD-10-CM | POA: Insufficient documentation

## 2024-09-05 DIAGNOSIS — L732 Hidradenitis suppurativa: Secondary | ICD-10-CM | POA: Insufficient documentation

## 2024-09-05 NOTE — Progress Notes (Signed)
" °  Subjective:     Patient ID: Diana Ramirez, female   DOB: September 27, 1995, 29 y.o.   MRN: [id number]  HPI Diana Ramirez is a 29 year old black female, single, G0P0, in complaining of bump in vaginal area, the burst 3 days ago, had noticed it about a week ago.     Component Value Date/Time   DIAGPAP  03/25/2022 1434    - Negative for intraepithelial lesion or malignancy (NILM)   ADEQPAP  03/25/2022 1434    Satisfactory for evaluation; transformation zone component PRESENT.     Review of Systems +bump in vaginal area, it burst about 3 days ago Reviewed past medical,surgical, social and family history. Reviewed medications and allergies.     Objective:   Physical Exam BP 109/70 (BP Location: Left Arm, Patient Position: Sitting, Cuff Size: Normal)   Pulse 75   Ht 5' 2 (1.575 m)   Wt 156 lb 8 oz (71 kg)   LMP 08/05/2024 (Approximate)   BMI 28.62 kg/m     Skin warm and dry.Pelvic: external genitalia is normal in appearance, has folliculitis like bump right groin, and scarring from HS, bilateral groin area, vagina: white discharge with slight  odor,urethra has no lesions or masses noted, cervix:smooth, uterus: normal size, shape and contour, non tender, no masses felt, adnexa: no masses or tenderness noted. Bladder is non tender and no masses felt. CV swab obtained. Fall risk is moderate  Upstream - 09/05/24 0954       Pregnancy Intention Screening   Does the patient want to become pregnant in the next year? Yes    Does the patient's partner want to become pregnant in the next year? Yes    Would the patient like to discuss contraceptive options today? No      Contraception Wrap Up   Current Method Pregnant/Seeking Pregnancy    End Method Pregnant/Seeking Pregnancy    Contraception Counseling Provided No         Examination chaperoned by Clarita Salt LPN  Assessment:     1. Folliculitis of perineum (Primary) Has already burst, no tender now She is waxing instead of shaving Can use  warm compress if needed   2. 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 Screening examination for STD (sexually transmitted disease) CV swab sent for GC/CHL,trich,BV and yeast  Check HIV and RPR - Cervicovaginal ancillary only( [facility]) - HIV Antibody (routine testing w rflx) - RPR W/RFLX TO RPR TITER, TREPONEMAL AB, SCREEN AND DIAGNOSIS  4. Vaginal discharge CV swab sent  - Cervicovaginal ancillary only( [facility])     Plan:     Follow up prn     "

## 2024-09-06 ENCOUNTER — Ambulatory Visit: Payer: Self-pay | Admitting: Adult Health

## 2024-09-06 LAB — SYPHILIS: RPR W/REFLEX TO RPR TITER AND TREPONEMAL ANTIBODIES, TRADITIONAL SCREENING AND DIAGNOSIS ALGORITHM: RPR Ser Ql: NONREACTIVE

## 2024-09-06 LAB — HIV ANTIBODY (ROUTINE TESTING W REFLEX): HIV Screen 4th Generation wRfx: NONREACTIVE
